# Patient Record
Sex: Female | Born: 1985 | Race: White | Hispanic: No | Marital: Single
Health system: Southern US, Community
[De-identification: ages and names within clinical notes are randomized; demographics above are authoritative.]

## PROBLEM LIST (undated history)

## (undated) DIAGNOSIS — Z789 Other specified health status: Secondary | ICD-10-CM

## (undated) HISTORY — PX: NO PAST SURGERIES: SHX2092

---
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
## 2004-01-10 ENCOUNTER — Emergency Department (HOSPITAL_COMMUNITY): Admission: EM | Admit: 2004-01-10 | Discharge: 2004-01-10 | Payer: Self-pay | Admitting: Emergency Medicine

## 2010-09-24 ENCOUNTER — Inpatient Hospital Stay (HOSPITAL_COMMUNITY)
Admission: AD | Admit: 2010-09-24 | Discharge: 2010-09-24 | Payer: Self-pay | Source: Home / Self Care | Attending: Obstetrics & Gynecology | Admitting: Obstetrics & Gynecology

## 2010-09-29 ENCOUNTER — Inpatient Hospital Stay (HOSPITAL_COMMUNITY)
Admission: AD | Admit: 2010-09-29 | Discharge: 2010-09-29 | Payer: Self-pay | Source: Home / Self Care | Attending: Obstetrics & Gynecology | Admitting: Obstetrics & Gynecology

## 2010-10-01 LAB — WET PREP, GENITAL
Clue Cells Wet Prep HPF POC: NONE SEEN
Trich, Wet Prep: NONE SEEN
Yeast Wet Prep HPF POC: NONE SEEN

## 2010-10-01 LAB — URINALYSIS, ROUTINE W REFLEX MICROSCOPIC
Bilirubin Urine: NEGATIVE
Bilirubin Urine: NEGATIVE
Ketones, ur: NEGATIVE mg/dL
Ketones, ur: NEGATIVE mg/dL
Nitrite: POSITIVE — AB
Nitrite: POSITIVE — AB
Protein, ur: >300 mg/dL — AB
Protein, ur: 30 mg/dL — AB
Specific Gravity, Urine: >1.03 — ABNORMAL HIGH (ref 1.005–1.030)
Specific Gravity, Urine: 1.025 (ref 1.005–1.030)
Urine Glucose, Fasting: NEGATIVE mg/dL
Urine Glucose, Fasting: NEGATIVE mg/dL
Urobilinogen, UA: 2 mg/dL — ABNORMAL HIGH (ref 0.0–1.0)
Urobilinogen, UA: 2 mg/dL — ABNORMAL HIGH (ref 0.0–1.0)
pH: 6.5 (ref 5.0–8.0)
pH: 7.5 (ref 5.0–8.0)

## 2010-10-01 LAB — URINE MICROSCOPIC-ADD ON

## 2010-10-01 LAB — POCT PREGNANCY, URINE: Preg Test, Ur: NEGATIVE

## 2010-10-03 LAB — URINE CULTURE
Colony Count: >=100000
Culture  Setup Time: 201201142022

## 2010-10-03 LAB — GC/CHLAMYDIA PROBE AMP, GENITAL
Chlamydia, DNA Probe: NEGATIVE
GC Probe Amp, Genital: NEGATIVE

## 2011-07-06 ENCOUNTER — Encounter (HOSPITAL_COMMUNITY): Payer: Self-pay | Admitting: Obstetrics and Gynecology

## 2011-07-06 ENCOUNTER — Inpatient Hospital Stay (HOSPITAL_COMMUNITY)
Admission: AD | Admit: 2011-07-06 | Discharge: 2011-07-06 | Disposition: A | Discharge status: Home / Self Care | Payer: Medicaid Other | Source: Ambulatory Visit | Attending: Obstetrics & Gynecology | Admitting: Obstetrics & Gynecology

## 2011-07-06 DIAGNOSIS — R109 Unspecified abdominal pain: Secondary | ICD-10-CM

## 2011-07-06 DIAGNOSIS — R1030 Lower abdominal pain, unspecified: Secondary | ICD-10-CM

## 2011-07-06 HISTORY — DX: Other specified health status: Z78.9

## 2011-07-06 LAB — URINALYSIS, ROUTINE W REFLEX MICROSCOPIC
Bilirubin Urine: NEGATIVE
Glucose, UA: NEGATIVE mg/dL
Ketones, ur: NEGATIVE mg/dL
Leukocytes, UA: NEGATIVE
Nitrite: NEGATIVE
Protein, ur: NEGATIVE mg/dL
Specific Gravity, Urine: 1.02 (ref 1.005–1.030)
Urobilinogen, UA: 1 mg/dL (ref 0.0–1.0)
pH: 6 (ref 5.0–8.0)

## 2011-07-06 LAB — WET PREP, GENITAL
Clue Cells Wet Prep HPF POC: NONE SEEN
Trich, Wet Prep: NONE SEEN
Yeast Wet Prep HPF POC: NONE SEEN

## 2011-07-06 LAB — URINE MICROSCOPIC-ADD ON

## 2011-07-06 LAB — POCT PREGNANCY, URINE: Preg Test, Ur: NEGATIVE

## 2011-07-06 MED ORDER — IBUPROFEN 600 MG PO TABS: 600.0000 mg | ORAL_TABLET | Freq: Four times a day (QID) | ORAL | Status: AC | PRN

## 2011-07-06 NOTE — ED Provider Notes (Signed)
Latoya Trujillo y.o.G1P1000 Chief Complaint  Patient presents with  . Abdominal Pain    SUBJECTIVE  HPI: she woke this morning with first episode of suprapubic sharp intermittent abdominal pain. She describes it as feeling more like pressure and cramping. She also began having some vaginal spotting. LMP began 1 wk ago and she had stopped for 3-4 days before today. No antecedent intercourse. No new partner. Denies vaginal irritation, itch or abnormal discharge. Denies dysuria, urinary frequency, hematuria, urgency. Not using birth control and does not want to. Last sexual activity was months ago. BMs normal and no GI distress.  Past Medical History  Diagnosis Date  . No pertinent past medical history    Ob Hx: Gyn Hx: Past Surgical History  Procedure Date  . No past surgeries    History   Social History  . Marital Status: Single    Spouse Name: N/A    Number of Children: N/A  . Years of Education: N/A   Occupational History  . Not on file.   Social History Main Topics  . Smoking status: Not on file  . Smokeless tobacco: Not on file  . Alcohol Use:   . Drug Use:   . Sexually Active:    Other Topics Concern  . Not on file   Social History Narrative  . No narrative on file   No current facility-administered medications on file prior to encounter.   No current outpatient prescriptions on file prior to encounter.   No Known Allergies  ROS: Pertinent items in HPI  OBJECTIVE  BP 128/82  Pulse 93  Temp(Src) 98.5 F (36.9 C) (Oral)  Resp 18  Ht 5\' 5"  (1.651 m)  Wt 62.234 kg (137 lb 3.2 oz)  BMI 22.83 kg/m2  LMP 06/30/2011   Physical Exam:  General: WN/WD in NAD Abd: soft, NT, ND, no masses Pelvic: NEFG  Vagina with scant dk brown and milky discharge, cx clean  No CMT, Uterus retroverted, NT to minimally tender, no adnexal tenderness or mass Back: No CVAT  Results for orders placed during the hospital encounter of 07/06/11 (from the past 24 hour(s))    URINALYSIS, ROUTINE W REFLEX MICROSCOPIC     Status: Abnormal   Collection Time   07/06/11  5:10 PM      Component Value Range   Color, Urine YELLOW  YELLOW    Appearance HAZY (*) CLEAR    Specific Gravity, Urine 1.020  1.005 - 1.030    pH 6.0  5.0 - 8.0    Glucose, UA NEGATIVE  NEGATIVE (mg/dL)   Hgb urine dipstick MODERATE (*) NEGATIVE    Bilirubin Urine NEGATIVE  NEGATIVE    Ketones, ur NEGATIVE  NEGATIVE (mg/dL)   Protein, ur NEGATIVE  NEGATIVE (mg/dL)   Urobilinogen, UA 1.0  0.0 - 1.0 (mg/dL)   Nitrite NEGATIVE  NEGATIVE    Leukocytes, UA NEGATIVE  NEGATIVE   URINE MICROSCOPIC-ADD ON     Status: Normal   Collection Time   07/06/11  5:10 PM      Component Value Range   Squamous Epithelial / LPF RARE  RARE    RBC / HPF 0-2  <3 (RBC/hpf)   Bacteria, UA RARE  RARE   POCT PREGNANCY, URINE     Status: Normal   Collection Time   07/06/11  5:21 PM      Component Value Range   Preg Test, Ur NEGATIVE    WET PREP, GENITAL     Status: Abnormal  Collection Time   07/06/11  6:00 PM      Component Value Range   Yeast, Wet Prep NONE SEEN  NONE SEEN    Trich, Wet Prep NONE SEEN  NONE SEEN    Clue Cells, Wet Prep NONE SEEN  NONE SEEN    WBC, Wet Prep HPF POC FEW (*) NONE SEEN      ASSESSMENT  Spotting on D7 of menses Mild suprapubic discomfort without evidence UTI or cervicitis   PLAN Discussed need for PCP and contraception, safe sex. F/U with [facility] if abd pain is not self-limited

## 2011-07-06 NOTE — Progress Notes (Signed)
Onset of lower abdominal pain midline since this morning worse as the day has gone, worse when walking, no vaginal discharge, LMP 10/14 spotting today

## 2011-07-06 NOTE — Progress Notes (Signed)
Pt presents to MAU with complaints of "sharp" abdominal pain that started today while she was carving a pumpkin. Pt states she is not pregnant, pt states she has not taken anything for the pain. Pt states this is a new pain

## 2011-07-08 LAB — GC/CHLAMYDIA PROBE AMP, GENITAL
Chlamydia, DNA Probe: NEGATIVE
GC Probe Amp, Genital: NEGATIVE

## 2011-07-09 NOTE — ED Provider Notes (Signed)
Attestation of Attending Supervision of Advanced Practitioner: Evaluation and management procedures were performed by the PA/NP/CNM/OB Fellow under my supervision/collaboration. Chart reviewed and agree with management and plan.  Jaynie Collins A M.D. 07/09/2011 11:18 AM

## 2012-03-25 ENCOUNTER — Emergency Department (HOSPITAL_COMMUNITY): Payer: Medicaid Other

## 2012-03-25 ENCOUNTER — Emergency Department (HOSPITAL_COMMUNITY)
Admission: EM | Admit: 2012-03-25 | Discharge: 2012-03-26 | Disposition: A | Discharge status: Home / Self Care | Payer: Medicaid Other | Attending: Emergency Medicine | Admitting: Emergency Medicine

## 2012-03-25 ENCOUNTER — Encounter (HOSPITAL_COMMUNITY): Payer: Self-pay | Admitting: Emergency Medicine

## 2012-03-25 DIAGNOSIS — M25519 Pain in unspecified shoulder: Secondary | ICD-10-CM | POA: Insufficient documentation

## 2012-03-25 DIAGNOSIS — X58XXXA Exposure to other specified factors, initial encounter: Secondary | ICD-10-CM | POA: Insufficient documentation

## 2012-03-25 DIAGNOSIS — S46919A Strain of unspecified muscle, fascia and tendon at shoulder and upper arm level, unspecified arm, initial encounter: Secondary | ICD-10-CM

## 2012-03-25 DIAGNOSIS — IMO0002 Reserved for concepts with insufficient information to code with codable children: Secondary | ICD-10-CM | POA: Insufficient documentation

## 2012-03-25 NOTE — ED Notes (Signed)
Pt was playing in pool with children and thinks she may have injured her shoulder; painful to raise up.

## 2012-03-26 MED ORDER — NAPROXEN 500 MG PO TABS: 500.0000 mg | ORAL_TABLET | Freq: Two times a day (BID) | ORAL | Status: AC

## 2012-03-26 NOTE — ED Provider Notes (Signed)
History     CSN: [id number]  Arrival date & time 03/25/12  2128   First MD Initiated Contact with Patient 03/25/12 2316      Chief Complaint  Patient presents with  . Shoulder Pain   HPI  History provided by the patient. Patient is a 26 year old female with no significant past medical history who presents with complaints of right shoulder pain injury. Patient states she began to have shoulder pains while playing at the pool for children earlier today. Patient was stretching children is a jumped into the pool. Pain is worse with abduction and better with rest. Patient has not used any medications for symptoms. She denies any other aggravating or alleviating factors. She denies any associated numbness or weakness in hand. Symptoms are described as mild/moderate.    Past Medical History  Diagnosis Date  . No pertinent past medical history     Past Surgical History  Procedure Date  . No past surgeries     No family history on file.  History  Substance Use Topics  . Smoking status: Not on file  . Smokeless tobacco: Not on file  . Alcohol Use:     OB History    Grav Para Term Preterm Abortions TAB SAB Ect Mult Living   1 1 1  0 0 0 0 0 0 1      Review of Systems  HENT: Negative for neck pain.   Musculoskeletal: Negative for back pain.       Shoulder pain  Skin: Negative for rash.  Neurological: Negative for weakness and numbness.    Allergies  Review of patient's allergies indicates no known allergies.  Home Medications   Current Outpatient Rx  Name Route Sig Dispense Refill  . NORGESTIM-ETH ESTRAD TRIPHASIC 0.18/0.215/0.25 MG-35 MCG PO TABS Oral Take 1 tablet by mouth daily.      BP 125/80  Pulse 98  Temp 98.9 F (37.2 C) (Oral)  Resp 20  SpO2 100%  LMP 03/01/2012  Physical Exam  Nursing note and vitals reviewed. Constitutional: She is oriented to person, place, and time. She appears well-developed and well-nourished. No distress.  HENT:  Head:  Normocephalic.  Cardiovascular: Normal rate and regular rhythm.   Pulmonary/Chest: Effort normal and breath sounds normal.  Abdominal: Soft.  Musculoskeletal:       Normal flexion and extension shoulder. Pain with active abduction. There is no deformity or pain over the a.c. joint and clavicle. Patient has full range of motion of elbow and distal joints. Patient has normal radial pulses, grip strength, sensation in fingers and cap refill less than 2 seconds.  Neurological: She is alert and oriented to person, place, and time.  Skin: Skin is warm and dry. No rash noted.  Psychiatric: She has a normal mood and affect. Her behavior is normal.    ED Course  Procedures   Dg Shoulder Right  03/25/2012  *RADIOLOGY REPORT*  Clinical Data: Injury  RIGHT SHOULDER - 2+ VIEW  Comparison: None.  Findings: No acute fracture and no dislocation.  Unremarkable soft tissues.  IMPRESSION: No acute bony pathology.  Original Report Authenticated By: Donavan Burnet, M.D.     1. Shoulder strain       MDM  11:50 AM patient seen and evaluated. Patient in no acute distress. Patient with normal x-ray without acute injury. At this time suspect shoulder strain of the rotator cuff area.        Angus Seller, [place] 03/26/12 [phone]

## 2012-03-26 NOTE — ED Provider Notes (Signed)
Medical screening examination/treatment/procedure(s) were performed by non-physician practitioner and as supervising physician I was immediately available for consultation/collaboration.   Charles B. Sheldon, MD 03/26/12 0657 

## 2014-02-02 IMAGING — CR DG SHOULDER 2+V*R*
3 series · 3 of 3 positions shown · non-contrast
Comparison: None.

CLINICAL DATA: Injury

RIGHT SHOULDER - 2+ VIEW

[w shoulder ap internal righ]
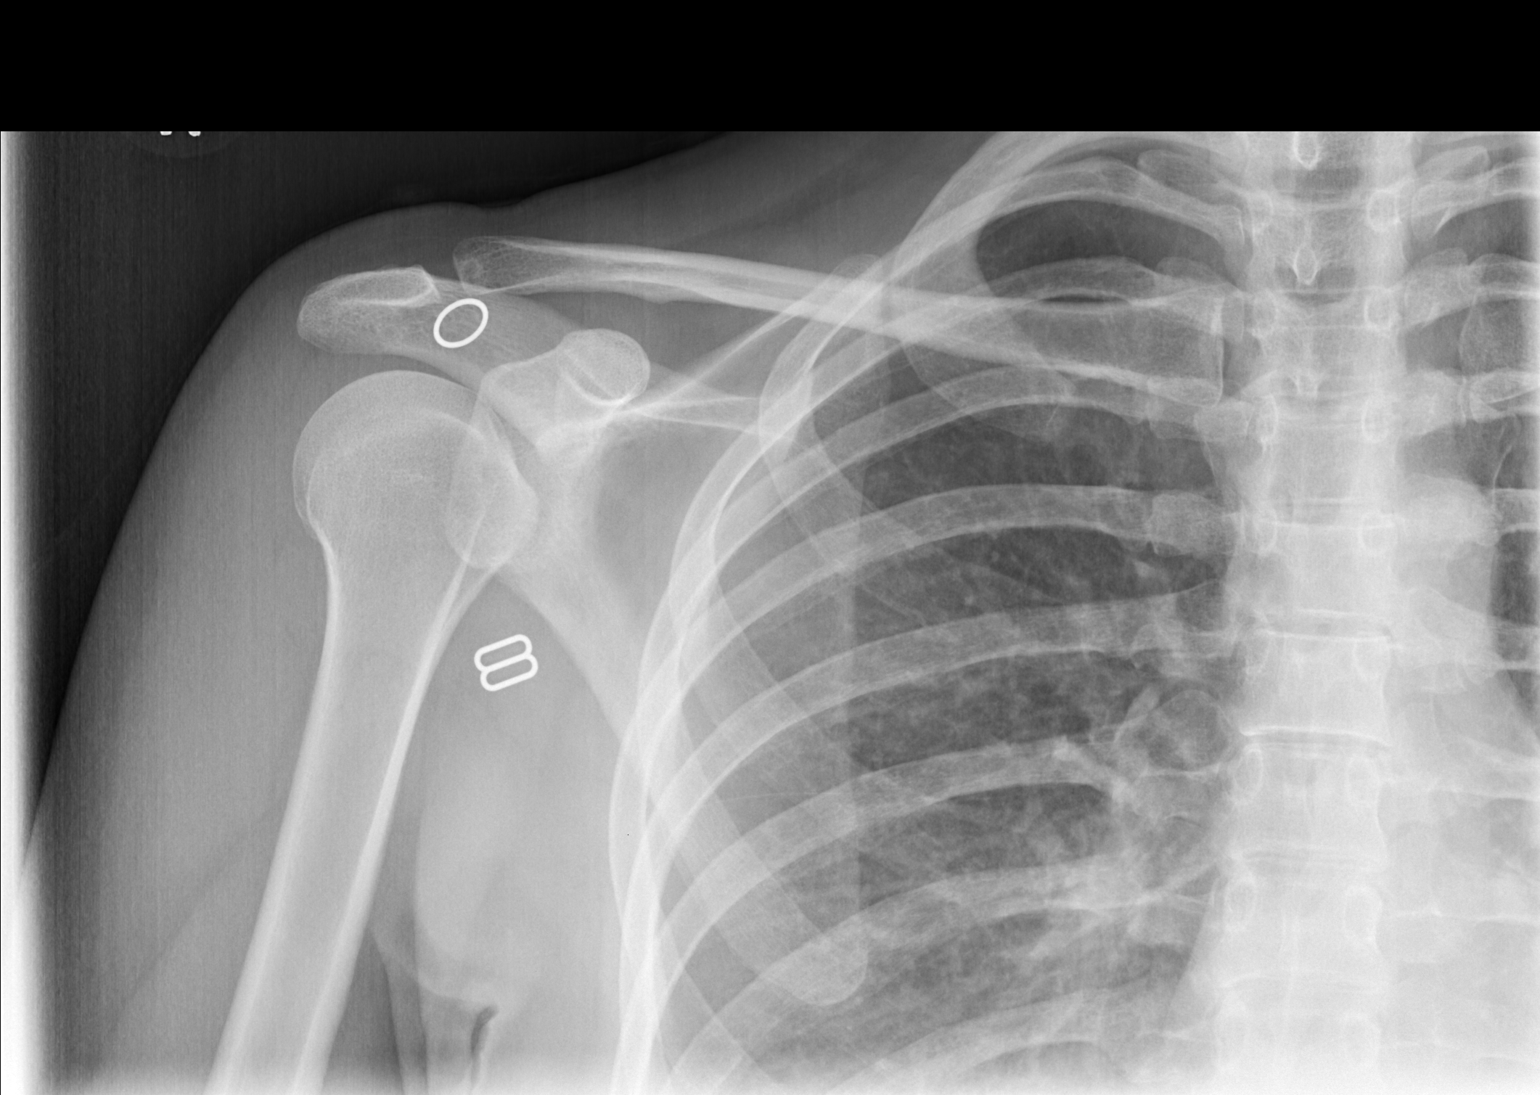

[w shoulder ap external righ]
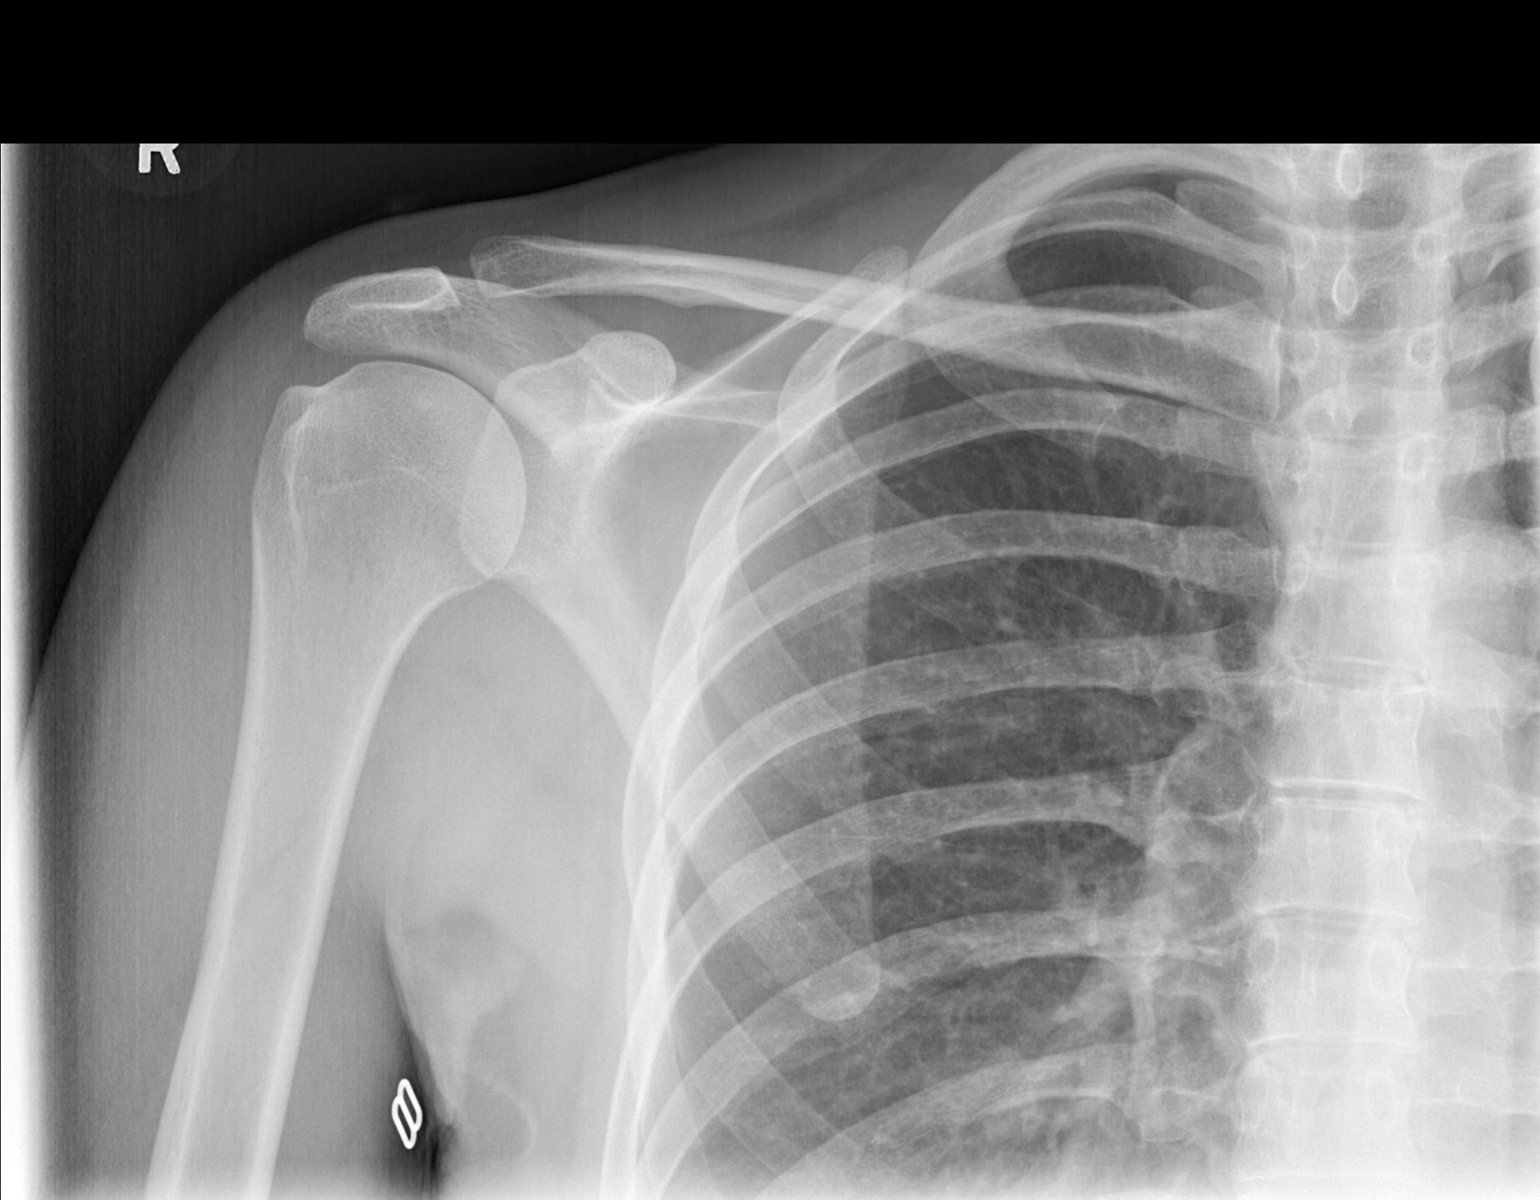

[w shoulder y view right]
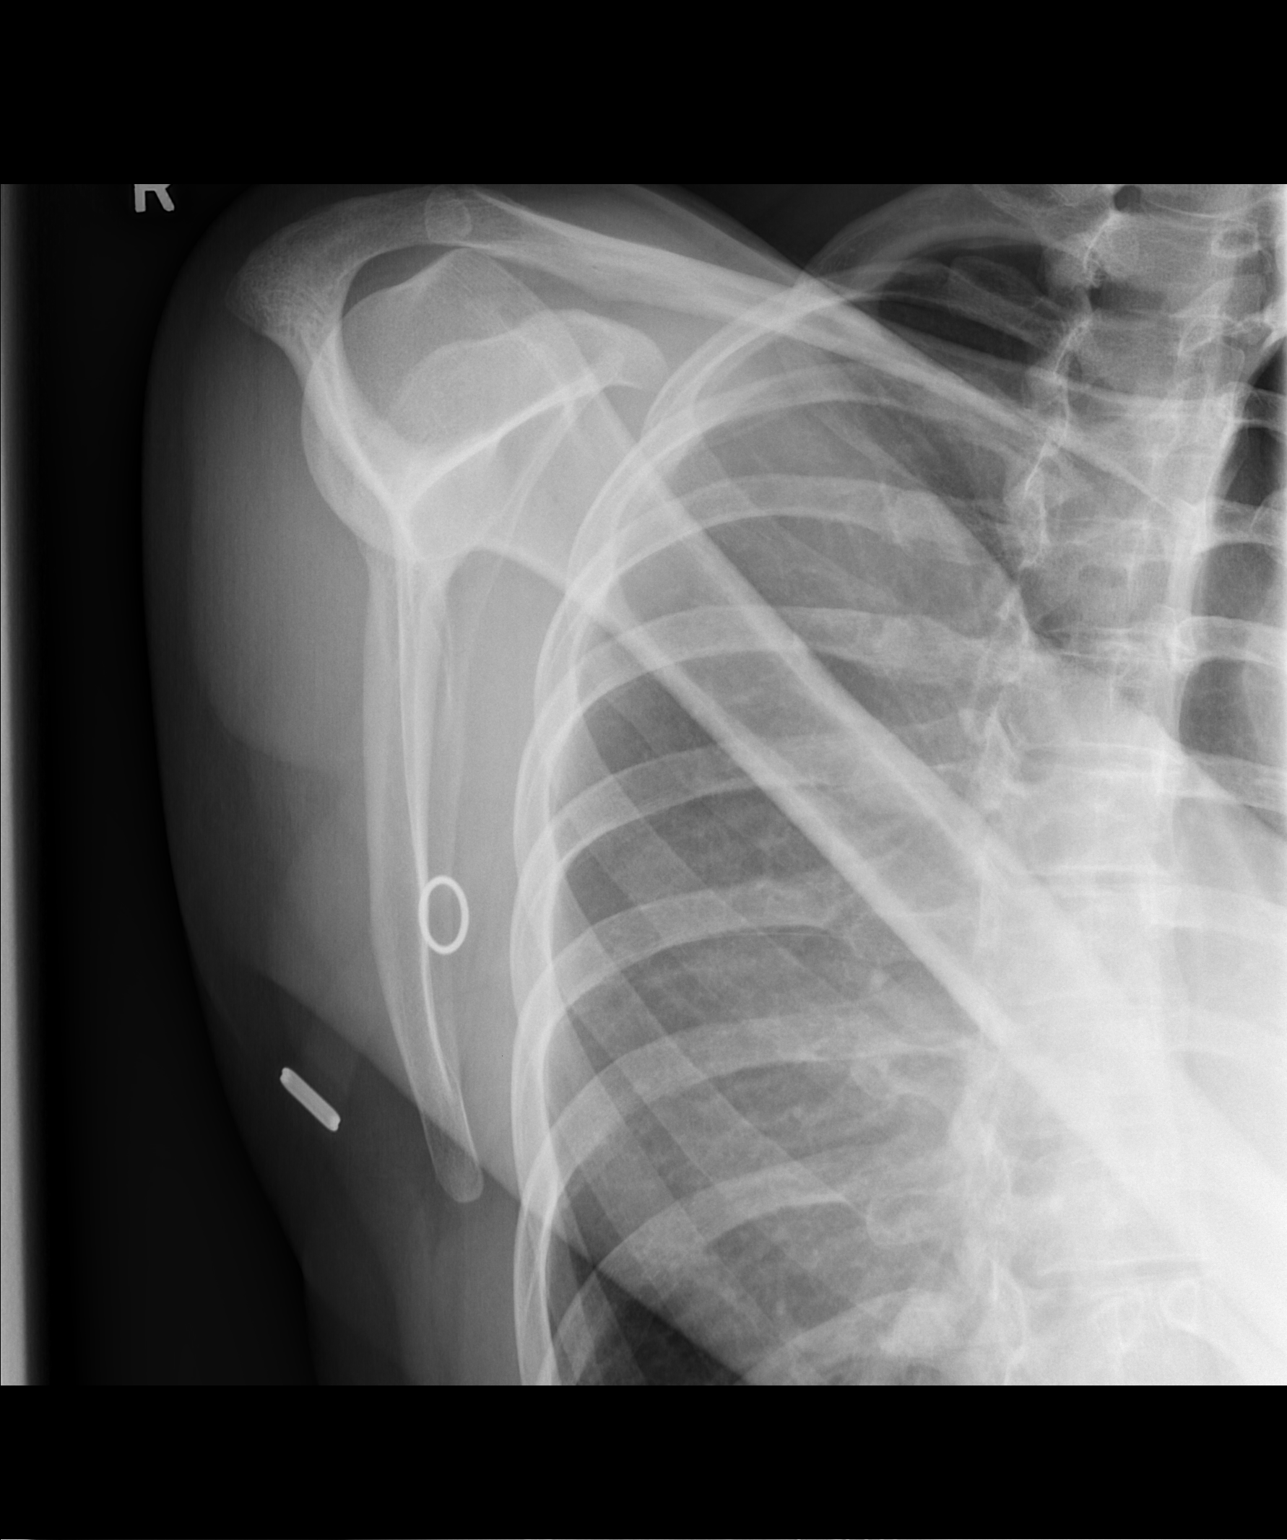

[3 of 3 positions shown; findings below may reference images not displayed]

FINDINGS: No acute fracture and no dislocation.  Unremarkable soft
tissues.
IMPRESSION: No acute bony pathology.

## 2014-07-18 ENCOUNTER — Encounter (HOSPITAL_COMMUNITY): Payer: Self-pay | Admitting: Emergency Medicine

## 2015-12-12 ENCOUNTER — Inpatient Hospital Stay (HOSPITAL_COMMUNITY)
Admission: AD | Admit: 2015-12-12 | Discharge: 2015-12-12 | Disposition: A | Discharge status: Home / Self Care | Payer: Self-pay | Source: Ambulatory Visit | Attending: Family Medicine | Admitting: Family Medicine

## 2015-12-12 ENCOUNTER — Encounter (HOSPITAL_COMMUNITY): Payer: Self-pay | Admitting: *Deleted

## 2015-12-12 DIAGNOSIS — Z3202 Encounter for pregnancy test, result negative: Secondary | ICD-10-CM | POA: Insufficient documentation

## 2015-12-12 DIAGNOSIS — R32 Unspecified urinary incontinence: Secondary | ICD-10-CM | POA: Insufficient documentation

## 2015-12-12 DIAGNOSIS — Z87440 Personal history of urinary (tract) infections: Secondary | ICD-10-CM | POA: Insufficient documentation

## 2015-12-12 LAB — URINE MICROSCOPIC-ADD ON

## 2015-12-12 LAB — URINALYSIS, ROUTINE W REFLEX MICROSCOPIC
Bilirubin Urine: NEGATIVE
Glucose, UA: NEGATIVE mg/dL
Ketones, ur: NEGATIVE mg/dL
Leukocytes, UA: NEGATIVE
Nitrite: NEGATIVE
Protein, ur: NEGATIVE mg/dL
Specific Gravity, Urine: <1.005 — ABNORMAL LOW (ref 1.005–1.030)
pH: 6.5 (ref 5.0–8.0)

## 2015-12-12 LAB — POCT PREGNANCY, URINE: Preg Test, Ur: NEGATIVE

## 2015-12-12 NOTE — Discharge Instructions (Signed)
Urinary Incontinence Urinary incontinence is the involuntary loss of urine from your bladder. CAUSES  There are many causes of urinary incontinence. They include:  Medicines.  Infections.  Prostatic enlargement, leading to overflow of urine from your bladder.  Surgery.  Neurological diseases.  Emotional factors. SIGNS AND SYMPTOMS Urinary Incontinence can be divided into four types: 1. Urge incontinence. Urge incontinence is the involuntary loss of urine before you have the opportunity to go to the bathroom. There is a sudden urge to void but not enough time to reach a bathroom. 2. Stress incontinence. Stress incontinence is the sudden loss of urine with any activity that forces urine to pass. It is commonly caused by anatomical changes to the pelvis and sphincter areas of your body. 3. Overflow incontinence. Overflow incontinence is the loss of urine from an obstructed opening to your bladder. This results in a backup of urine and a resultant buildup of pressure within the bladder. When the pressure within the bladder exceeds the closing pressure of the sphincter, the urine overflows, which causes incontinence, similar to water overflowing a dam. 4. Total incontinence. Total incontinence is the loss of urine as a result of the inability to store urine within your bladder. DIAGNOSIS  Evaluating the cause of incontinence may require:  A thorough and complete medical and obstetric history.  A complete physical exam.  Laboratory tests such as a urine culture and sensitivities. When additional tests are indicated, they can include:  An ultrasound exam.  Kidney and bladder X-rays.  Cystoscopy. This is an exam of the bladder using a narrow scope.  Urodynamic testing to test the nerve function to the bladder and sphincter areas. TREATMENT  Treatment for urinary incontinence depends on the cause:  For urge incontinence caused by a bacterial infection, antibiotics will be prescribed.  If the urge incontinence is related to medicines you take, your health care provider may have you change the medicine.  For stress incontinence, surgery to re-establish anatomical support to the bladder or sphincter, or both, will often correct the condition.  For overflow incontinence caused by an enlarged prostate, an operation to open the channel through the enlarged prostate will allow the flow of urine out of the bladder. In women with fibroids, a hysterectomy may be recommended.  For total incontinence, surgery on your urinary sphincter may help. An artificial urinary sphincter (an inflatable cuff placed around the urethra) may be required. In women who have developed a hole-like passage between their bladder and vagina (vesicovaginal fistula), surgery to close the fistula often is required. HOME CARE INSTRUCTIONS  Normal daily hygiene and the use of pads or adult diapers that are changed regularly will help prevent odors and skin damage.  Avoid caffeine. It can overstimulate your bladder.  Use the bathroom regularly. Try about every 2-3 hours to go to the bathroom, even if you do not feel the need to do so. Take time to empty your bladder completely. After urinating, wait a minute. Then try to urinate again.  For causes involving nerve dysfunction, keep a log of the medicines you take and a journal of the times you go to the bathroom. SEEK MEDICAL CARE IF:  You experience worsening of pain instead of improvement in pain after your procedure.  Your incontinence becomes worse instead of better. SEE IMMEDIATE MEDICAL CARE IF:  You experience fever or shaking chills.  You are unable to pass your urine.  You have redness spreading into your groin or down into your thighs. MAKE SURE   YOU:   Understand these instructions.   Will watch your condition.  Will get help right away if you are not doing well or get worse.   This information is not intended to replace advice given to you  by your health care provider. Make sure you discuss any questions you have with your health care provider.   Document Released: 10/10/2004 Document Revised: 09/23/2014 Document Reviewed: 02/09/2013 Elsevier Interactive Patient Education 2016 [employer]  

## 2015-12-12 NOTE — MAU Note (Signed)
PT SAYS HAS HX OF UTI.      ON Sunday-     WET ON SELF-     AND  AGAIN  TODAY-    IS UNABLE TO CONTROL  URINE.       SAYS HAS INCREASE FREG.   ,  NO  BURNING  .      NO UROLOGISTS.      HAS BEEN HAPPENING  X6 MTHS  BUT  WORSE  IN   PAST WEEK.       BIRTH  CONTROL-   BCP.

## 2015-12-12 NOTE — MAU Provider Note (Signed)
Chief Complaint:  Cystitis   Provider saw patient at 2030 hrs    HPI  Latoya Trujillo is a 30 y.o. G1P1001 who presents to maternity admissions reporting urinary incontinence for 6 months.  Has had more episodes lately along with urinary frequency. Thinks it is UTI, as she had had them before with similar symptoms. She reports vaginal bleeding, vaginal itching/burning, urinary symptoms, h/a, dizziness, n/v, or fever/chills.    RN Note: PT SAYS HAS HX OF UTI. ON Sunday- WET ON SELF- AND AGAIN TODAY-  IS UNABLE TO CONTROL URINE. SAYS HAS INCREASE FREG. , NO BURNING . NO UROLOGISTS. HAS BEEN HAPPENING X6 MTHS BUT WORSE IN PAST WEEK. BIRTH CONTROL- BCP.            Past Medical History: Past Medical History  Diagnosis Date  . No pertinent past medical history     Past obstetric history: OB History  Gravida Para Term Preterm AB SAB TAB Ectopic Multiple Living  0 0 0 0 0 0 1    # Outcome Date GA Lbr Len/2nd Weight Sex Delivery Anes PTL Lv  1 Term      Vag-Spont         Past Surgical History: Past Surgical History  Procedure Laterality Date  . No past surgeries      Family History: No family history on file.  Social History: Social History  Substance Use Topics  . Smoking status: None  . Smokeless tobacco: None  . Alcohol Use: None    Allergies: No Known Allergies  Meds:  Prescriptions prior to admission  Medication Sig Dispense Refill Last Dose  . Norgestimate-Ethinyl Estradiol Triphasic (TRI-PREVIFEM) 0.18/0.215/0.25 MG-35 MCG tablet Take 1 tablet by mouth daily.   03/25/2012 at Unknown    I have reviewed patient's Past Medical Hx, Surgical Hx, Family Hx, Social Hx, medications and allergies.  ROS:  Review of Systems  Constitutional: Negative for fever, chills and fatigue.  Respiratory: Negative for shortness of breath.   Gastrointestinal: Negative for nausea, vomiting, abdominal pain, diarrhea  and constipation.  Genitourinary: Positive for urgency and frequency (and urinary incontinence). Negative for dysuria, flank pain, vaginal bleeding, vaginal discharge and difficulty urinating.  Musculoskeletal: Negative for myalgias and back pain.   Other systems negative     Physical Exam  Patient Vitals for the past 24 hrs:  BP Temp Temp src Pulse Resp Height Weight  12/12/15 1939 123/88 mmHg 98.2 F (36.8 C) Oral 82 20  (1.575 m) 137 lb 6 oz (62.313 kg)   Constitutional: Well-developed, well-nourished female in no acute distress.  Cardiovascular: normal rate and rhythm, no ectopy audible Respiratory: normal effort, no distress. Lungs CTAB  GI: Abd soft, non-tender.  Nondistended.  No rebound, No guarding. MS: Extremities nontender, no edema, normal ROM Neurologic: Alert and oriented x 4.   Grossly nonfocal. GU: Neg CVAT. Skin:  Warm and Dry Psych:  Affect appropriate.  PELVIC EXAM: not indicated    Labs: Results for orders placed or performed during the hospital encounter of 12/12/15 (from the past 24 hour(s))  Urinalysis, Routine w reflex microscopic (not at [facility])     Status: Abnormal   Collection Time: 12/12/15  7:41 PM  Result Value Ref Range   Color, Urine YELLOW YELLOW   APPearance CLEAR CLEAR   Specific Gravity, Urine <1.005 (L) 1.005 - 1.030   pH 6.5 5.0 - 8.0   Glucose, UA NEGATIVE NEGATIVE mg/dL   Hgb urine dipstick SMALL (A) NEGATIVE  Bilirubin Urine NEGATIVE NEGATIVE   Ketones, ur NEGATIVE NEGATIVE mg/dL   Protein, ur NEGATIVE NEGATIVE mg/dL   Nitrite NEGATIVE NEGATIVE   Leukocytes, UA NEGATIVE NEGATIVE  Urine microscopic-add on     Status: Abnormal   Collection Time: 12/12/15  7:41 PM  Result Value Ref Range   Squamous Epithelial / LPF 0-5 (A) NONE SEEN   WBC, UA 0-5 0 - 5 WBC/hpf   RBC / HPF 0-5 0 - 5 RBC/hpf   Bacteria, UA RARE (A) NONE SEEN  Pregnancy, urine POC     Status: None   Collection Time: 12/12/15  8:12 PM  Result Value Ref Range    Preg Test, Ur NEGATIVE NEGATIVE      Imaging:  No results found.  MAU Course/MDM: I have ordered labs as follows: UA Imaging ordered: none Results reviewed.   Treatments in MAU included none  Discussed no evidence of UTI as source of incontinence.  Briefly discussed types of incontinence.  Discussed probably best next step would be a Urology consultation with Urodynamic testing.   Pt stable at time of discharge.  Assessment: Urinary incontinence x 6 months of uncertain type.  Plan: Discharge home Recommend Urology consult     Medication List    ASK your doctor about these medications        TRI-PREVIFEM 0.18/0.215/0.25 MG-35 MCG tablet  Generic drug:  Norgestimate-Ethinyl Estradiol Triphasic  Take 1 tablet by mouth daily.       Encouraged to return here or to other Urgent Care/ED if she develops worsening of symptoms, increase in pain, fever, or other concerning symptoms.   Wynelle BourgeoisMarie Williams CNM, MSN Certified Nurse-Midwife 12/12/2015 8:47 PM

## 2024-04-02 ENCOUNTER — Encounter: Payer: Self-pay | Admitting: Advanced Practice Midwife
